# Patient Record
Sex: Male | Born: 2000 | Race: White | Hispanic: No | Marital: Single | Smoking: Never smoker
Health system: Southern US, Community
[De-identification: ages and names within clinical notes are randomized; demographics above are authoritative.]

## PROBLEM LIST (undated history)

## (undated) DIAGNOSIS — F909 Attention-deficit hyperactivity disorder, unspecified type: Secondary | ICD-10-CM

## (undated) HISTORY — DX: Attention-deficit hyperactivity disorder, unspecified type: F90.9

---
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
## 2020-02-27 DIAGNOSIS — Z23 Encounter for immunization: Secondary | ICD-10-CM | POA: Diagnosis not present

## 2020-03-19 DIAGNOSIS — Z23 Encounter for immunization: Secondary | ICD-10-CM | POA: Diagnosis not present

## 2021-07-14 ENCOUNTER — Other Ambulatory Visit (HOSPITAL_COMMUNITY): Payer: Self-pay

## 2021-07-14 MED ORDER — CARESTART COVID-19 HOME TEST VI KIT
PACK | 0 refills | Status: AC
  Filled 2021-07-14: qty 4, 4d supply, fill #0

## 2022-07-15 ENCOUNTER — Ambulatory Visit: Payer: 59 | Admitting: Family Medicine

## 2022-07-15 ENCOUNTER — Encounter: Payer: Self-pay | Admitting: Family Medicine

## 2022-07-15 VITALS — BP 115/72 | HR 81 | Temp 98.3°F | Ht 71.0 in | Wt 157.0 lb

## 2022-07-15 DIAGNOSIS — Z0000 Encounter for general adult medical examination without abnormal findings: Secondary | ICD-10-CM | POA: Insufficient documentation

## 2022-07-15 DIAGNOSIS — F909 Attention-deficit hyperactivity disorder, unspecified type: Secondary | ICD-10-CM | POA: Diagnosis not present

## 2022-07-15 DIAGNOSIS — Z113 Encounter for screening for infections with a predominantly sexual mode of transmission: Secondary | ICD-10-CM | POA: Diagnosis not present

## 2022-07-15 DIAGNOSIS — Z0001 Encounter for general adult medical examination with abnormal findings: Secondary | ICD-10-CM | POA: Diagnosis not present

## 2022-07-15 DIAGNOSIS — Z1159 Encounter for screening for other viral diseases: Secondary | ICD-10-CM | POA: Diagnosis not present

## 2022-07-15 DIAGNOSIS — Z114 Encounter for screening for human immunodeficiency virus [HIV]: Secondary | ICD-10-CM | POA: Diagnosis not present

## 2022-07-15 LAB — CBC WITH DIFFERENTIAL/PLATELET
Absolute Monocytes: 511 cells/uL (ref 200–950)
Eosinophils Relative: 1.1 %
MCH: 31.6 pg (ref 27.0–33.0)
MCV: 89.6 fL (ref 80.0–100.0)
MPV: 10.4 fL (ref 7.5–12.5)
Monocytes Relative: 7.3 %
Neutro Abs: 4851 cells/uL (ref 1500–7800)
Neutrophils Relative %: 69.3 %
Platelets: 269 10*3/uL (ref 140–400)
Total Lymphocyte: 21.6 %

## 2022-07-15 NOTE — Assessment & Plan Note (Signed)

## 2022-07-15 NOTE — Patient Instructions (Signed)
It was great to meet you today and I'm excited to have you join the [facility] practice. I hope you had a positive experience today! If you feel so inclined, please feel free to recommend our practice to friends and family. Harvey Matlack, FNP-C  

## 2022-07-15 NOTE — Assessment & Plan Note (Addendum)
Reports history of ADHD on Concerta many years ago, would like to resume medications. Symptoms have been present for many years and interfere with day to day life at home, work, and school. Adult ADHD Self-Report Scale v1.1 he scored 6 in part A and 8 in part B. EKG done today in office normal. Patient will obtain medical records. Psychiatry referral placed for further evaluation. Conservative management at this time and patient will notify me if he needs medication management.

## 2022-07-15 NOTE — Progress Notes (Signed)
New Patient Office Visit  Subjective    Patient ID: Thomas Richards, male    DOB: 05-May-2001  Age: 21 y.o. MRN: [id number]  CC:  Chief Complaint  Patient presents with   Establish Care    HPI Thomas Richards presents to establish care. Oriented to practice routines and expectations. He was diagnosed with ADHD in elementary school and has been off of medications since high school, previously took Concerta and it worked well for him. Currently experiencing "time blindness" and trouble planning and executing tasks.   Vaccines: flu today, Tdap UTD, HPV UTD STI: consents to testing today    Outpatient Encounter Medications as of 07/15/2022  Medication Sig   COVID-19 At Home Antigen Test ([facility] COVID-19 HOME TEST) KIT Use as directed (Patient not taking: Reported on 07/15/2022)   No facility-administered encounter medications on file as of 07/15/2022.    Past Medical History:  Diagnosis Date   ADHD (attention deficit hyperactivity disorder)     History reviewed. No pertinent surgical history.  History reviewed. No pertinent family history.  Social History   Socioeconomic History   Marital status: Single    Spouse name: Not on file   Number of children: Not on file   Years of education: Not on file   Highest education level: Not on file  Occupational History   Not on file  Tobacco Use   Smoking status: Never   Smokeless tobacco: Never  Substance and Sexual Activity   Alcohol use: Never   Drug use: Never   Sexual activity: Yes  Other Topics Concern   Not on file  Social History Narrative   Pt is currently in school, [employer]   Social Determinants of Health   Financial Resource Strain: Not on file  Food Insecurity: Not on file  Transportation Needs: Not on file  Physical Activity: Not on file  Stress: Not on file  Social Connections: Not on file  Intimate Partner Violence: Not on file    Review of Systems  Constitutional: Negative.    HENT: Negative.    Eyes: Negative.   Respiratory: Negative.    Cardiovascular: Negative.   Gastrointestinal: Negative.   Genitourinary: Negative.   Musculoskeletal: Negative.   Skin: Negative.   Neurological: Negative.   Endo/Heme/Allergies: Negative.   Psychiatric/Behavioral: Negative.    All other systems reviewed and are negative.       Objective    BP 115/72   Pulse 81   Temp 98.3 F (36.8 C) (Oral)   Ht _0  (1.803 m)   Wt 157 lb (71.2 kg)   SpO2 98%   BMI 21.90 kg/m   Physical Exam Vitals and nursing note reviewed.  Constitutional:      Appearance: Normal appearance. He is normal weight.  HENT:     Head: Normocephalic and atraumatic.     Right Ear: Tympanic membrane, ear canal and external ear normal.     Left Ear: Tympanic membrane, ear canal and external ear normal.     Nose: Nose normal.     Mouth/Throat:     Mouth: Mucous membranes are moist.     Pharynx: Oropharynx is clear.  Eyes:     Extraocular Movements: Extraocular movements intact.     Right eye: Normal extraocular motion and no nystagmus.     Left eye: Normal extraocular motion and no nystagmus.     Conjunctiva/sclera: Conjunctivae normal.     Pupils: Pupils are equal, round, and reactive to light.  Cardiovascular:     Rate and Rhythm: Normal rate and regular rhythm.     Pulses: Normal pulses.     Heart sounds: Normal heart sounds.  Pulmonary:     Effort: Pulmonary effort is normal.     Breath sounds: Normal breath sounds.  Abdominal:     General: Abdomen is flat. Bowel sounds are normal.     Palpations: Abdomen is soft.  Genitourinary:    Comments: Deferred using shared decision making Musculoskeletal:        General: Normal range of motion.     Cervical back: Normal range of motion and neck supple.  Skin:    General: Skin is warm and dry.     Capillary Refill: Capillary refill takes less than 2 seconds.  Neurological:     General: No focal deficit present.     Mental Status:  He is alert. Mental status is at baseline.  Psychiatric:        Mood and Affect: Mood normal.        Speech: Speech normal.        Behavior: Behavior normal.        Thought Content: Thought content normal.        Cognition and Memory: Cognition and memory normal.        Judgment: Judgment normal.         Assessment & Plan:   Problem List Items Addressed This Visit       Other   Physical exam, annual - Primary    Today your medical history was reviewed and routine physical exam with labs was performed. Recommend 150 minutes of moderate intensity exercise weekly and consuming a well-balanced diet. Advised to stop smoking if a smoker, avoid smoking if a non-smoker, limit alcohol consumption to 1 drink per day for women and 2 drinks per day for men, and avoid illicit drug use. Counseled on safe sex practices and offered STI testing today. Counseled on the importance of sunscreen use. Counseled in mental health awareness and when to seek medical care. Vaccine maintenance discussed. Appropriate health maintenance items reviewed. Return to office in 1 year for annual physical exam.       Relevant Orders   CBC with Differential/Platelet   COMPLETE METABOLIC PANEL WITH GFR   Lipid panel   HIV Antibody (routine testing w rflx)   Hepatitis C Ab w/rfl RNA, PCR + Geno   RPR   Attention deficit hyperactivity disorder (ADHD)    Reports history of ADHD on Concerta many years ago, would like to resume medications. Symptoms have been present for many years and interfere with day to day life at home, work, and school. Adult ADHD Self-Report Scale v1.1 he scored 6 in part A and 8 in part B. EKG done today in office normal. Patient will obtain medical records. Psychiatry referral placed for further evaluation. Conservative management at this time and patient will notify me if he needs medication management.      Relevant Orders   EKG 12-Lead (Completed)   Ambulatory referral to Psychiatry   Other  Visit Diagnoses     Routine screening for STI (sexually transmitted infection)       Relevant Orders   HIV Antibody (routine testing w rflx)   Hepatitis C Ab w/rfl RNA, PCR + Geno   RPR   Screening for HIV (human immunodeficiency virus)       Relevant Orders   HIV Antibody (routine testing w rflx)   Need for hepatitis  C screening test       Relevant Orders   Hepatitis C Ab w/rfl RNA, PCR + Geno       Return in about 1 year (around 07/16/2023) for annual physical.   Rubie Maid, FNP

## 2022-07-16 LAB — COMPLETE METABOLIC PANEL WITH GFR
AG Ratio: 1.8 (calc) (ref 1.0–2.5)
ALT: 53 U/L — ABNORMAL HIGH (ref 9–46)
AST: 23 U/L (ref 10–40)
Albumin: 4.8 g/dL (ref 3.6–5.1)
Alkaline phosphatase (APISO): 58 U/L (ref 36–130)
BUN: 16 mg/dL (ref 7–25)
CO2: 27 mmol/L (ref 20–32)
Calcium: 9.8 mg/dL (ref 8.6–10.3)
Chloride: 102 mmol/L (ref 98–110)
Creat: 1.1 mg/dL (ref 0.60–1.24)
Globulin: 2.7 g/dL (calc) (ref 1.9–3.7)
Glucose, Bld: 84 mg/dL (ref 65–99)
Potassium: 4.3 mmol/L (ref 3.5–5.3)
Sodium: 140 mmol/L (ref 135–146)
Total Bilirubin: 1.5 mg/dL — ABNORMAL HIGH (ref 0.2–1.2)
Total Protein: 7.5 g/dL (ref 6.1–8.1)
eGFR: 97 mL/min/{1.73_m2} (ref >=60)

## 2022-07-16 LAB — CBC WITH DIFFERENTIAL/PLATELET
Basophils Absolute: 49 cells/uL (ref 0–200)
Basophils Relative: 0.7 %
Eosinophils Absolute: 77 cells/uL (ref 15–500)
HCT: 47.3 % (ref 38.5–50.0)
Hemoglobin: 16.7 g/dL (ref 13.2–17.1)
Lymphs Abs: 1512 cells/uL (ref 850–3900)
MCHC: 35.3 g/dL (ref 32.0–36.0)
RBC: 5.28 10*6/uL (ref 4.20–5.80)
RDW: 12.3 % (ref 11.0–15.0)
WBC: 7 10*3/uL (ref 3.8–10.8)

## 2022-07-16 LAB — LIPID PANEL
Cholesterol: 159 mg/dL (ref <200)
HDL: 53 mg/dL (ref >=40)
LDL Cholesterol (Calc): 90 mg/dL (calc)
Non-HDL Cholesterol (Calc): 106 mg/dL (calc) (ref <130)
Total CHOL/HDL Ratio: 3 (calc) (ref <5.0)
Triglycerides: 73 mg/dL (ref <150)

## 2022-07-16 LAB — RPR: RPR Ser Ql: NONREACTIVE

## 2022-07-16 LAB — HIV ANTIBODY (ROUTINE TESTING W REFLEX): HIV 1&2 Ab, 4th Generation: NONREACTIVE

## 2022-07-16 LAB — HEPATITIS C AB W/RFL RNA, PCR + GENO: Hepatitis C Ab: NONREACTIVE

## 2022-07-29 NOTE — Addendum Note (Signed)
Addended by: Rubie Maid on: 07/29/2022 08:18 AM   Modules accepted: Level of Service

## 2022-09-30 ENCOUNTER — Telehealth: Payer: Self-pay | Admitting: Family Medicine

## 2022-09-30 NOTE — Telephone Encounter (Signed)
Patient called to check on status of medical record request; stated his previous medical records were sent to [place] twice (08/18/22, and 09/11/22).  Requesting call back to verify records received as proof of diagnosis so he can receive a refill of his medication (lapse in dose of 2 months).  Please advise at [phone].

## 2022-09-30 NOTE — Telephone Encounter (Signed)
Spoke w/pt this afternoon, apologize about the records. Per pt"it's ok" he already has an appt w/a psychologist to get re-Dx and hopefully get medications.   Told pt if he needs anything else to contact our office. Pt voiced understanding.

## 2022-10-09 ENCOUNTER — Other Ambulatory Visit (HOSPITAL_BASED_OUTPATIENT_CLINIC_OR_DEPARTMENT_OTHER): Payer: Self-pay

## 2022-10-09 DIAGNOSIS — F418 Other specified anxiety disorders: Secondary | ICD-10-CM | POA: Diagnosis not present

## 2022-10-09 DIAGNOSIS — F900 Attention-deficit hyperactivity disorder, predominantly inattentive type: Secondary | ICD-10-CM | POA: Diagnosis not present

## 2022-10-09 MED ORDER — METHYLPHENIDATE HCL ER (OSM) 18 MG PO TBCR
18.0000 mg | EXTENDED_RELEASE_TABLET | Freq: Every day | ORAL | 0 refills | Status: AC
  Filled 2022-10-09: qty 30, 30d supply, fill #0

## 2022-10-13 DIAGNOSIS — F331 Major depressive disorder, recurrent, moderate: Secondary | ICD-10-CM | POA: Diagnosis not present

## 2022-10-22 DIAGNOSIS — F331 Major depressive disorder, recurrent, moderate: Secondary | ICD-10-CM | POA: Diagnosis not present

## 2022-10-23 ENCOUNTER — Other Ambulatory Visit (HOSPITAL_BASED_OUTPATIENT_CLINIC_OR_DEPARTMENT_OTHER): Payer: Self-pay

## 2022-10-23 DIAGNOSIS — F418 Other specified anxiety disorders: Secondary | ICD-10-CM | POA: Diagnosis not present

## 2022-10-23 DIAGNOSIS — F900 Attention-deficit hyperactivity disorder, predominantly inattentive type: Secondary | ICD-10-CM | POA: Diagnosis not present

## 2022-10-23 MED ORDER — METHYLPHENIDATE HCL ER (OSM) 36 MG PO TBCR: 36.0000 mg | EXTENDED_RELEASE_TABLET | Freq: Every day | ORAL | 0 refills | Status: AC

## 2022-11-02 DIAGNOSIS — F331 Major depressive disorder, recurrent, moderate: Secondary | ICD-10-CM | POA: Diagnosis not present

## 2022-11-12 DIAGNOSIS — F331 Major depressive disorder, recurrent, moderate: Secondary | ICD-10-CM | POA: Diagnosis not present

## 2022-11-13 ENCOUNTER — Other Ambulatory Visit (HOSPITAL_BASED_OUTPATIENT_CLINIC_OR_DEPARTMENT_OTHER): Payer: Self-pay

## 2022-11-13 DIAGNOSIS — F900 Attention-deficit hyperactivity disorder, predominantly inattentive type: Secondary | ICD-10-CM | POA: Diagnosis not present

## 2022-11-13 DIAGNOSIS — F418 Other specified anxiety disorders: Secondary | ICD-10-CM | POA: Diagnosis not present

## 2022-11-13 MED ORDER — METHYLPHENIDATE HCL ER (OSM) 36 MG PO TBCR
36.0000 mg | EXTENDED_RELEASE_TABLET | Freq: Every day | ORAL | 0 refills | Status: AC
  Filled 2022-11-13: qty 30, 30d supply, fill #0

## 2022-11-15 ENCOUNTER — Other Ambulatory Visit (HOSPITAL_BASED_OUTPATIENT_CLINIC_OR_DEPARTMENT_OTHER): Payer: Self-pay

## 2022-11-16 ENCOUNTER — Other Ambulatory Visit (HOSPITAL_BASED_OUTPATIENT_CLINIC_OR_DEPARTMENT_OTHER): Payer: Self-pay

## 2022-11-24 ENCOUNTER — Other Ambulatory Visit (HOSPITAL_BASED_OUTPATIENT_CLINIC_OR_DEPARTMENT_OTHER): Payer: Self-pay

## 2022-11-26 DIAGNOSIS — F331 Major depressive disorder, recurrent, moderate: Secondary | ICD-10-CM | POA: Diagnosis not present

## 2022-12-01 DIAGNOSIS — F331 Major depressive disorder, recurrent, moderate: Secondary | ICD-10-CM | POA: Diagnosis not present

## 2022-12-22 DIAGNOSIS — F331 Major depressive disorder, recurrent, moderate: Secondary | ICD-10-CM | POA: Diagnosis not present

## 2022-12-25 DIAGNOSIS — F418 Other specified anxiety disorders: Secondary | ICD-10-CM | POA: Diagnosis not present

## 2022-12-25 DIAGNOSIS — F900 Attention-deficit hyperactivity disorder, predominantly inattentive type: Secondary | ICD-10-CM | POA: Diagnosis not present

## 2022-12-26 ENCOUNTER — Other Ambulatory Visit (HOSPITAL_BASED_OUTPATIENT_CLINIC_OR_DEPARTMENT_OTHER): Payer: Self-pay

## 2022-12-26 MED ORDER — METHYLPHENIDATE HCL ER (OSM) 36 MG PO TBCR
36.0000 mg | EXTENDED_RELEASE_TABLET | Freq: Every day | ORAL | 0 refills | Status: AC
  Filled 2022-12-26: qty 30, 30d supply, fill #0

## 2022-12-28 ENCOUNTER — Other Ambulatory Visit (HOSPITAL_BASED_OUTPATIENT_CLINIC_OR_DEPARTMENT_OTHER): Payer: Self-pay

## 2022-12-30 ENCOUNTER — Other Ambulatory Visit (HOSPITAL_BASED_OUTPATIENT_CLINIC_OR_DEPARTMENT_OTHER): Payer: Self-pay

## 2023-01-05 DIAGNOSIS — F331 Major depressive disorder, recurrent, moderate: Secondary | ICD-10-CM | POA: Diagnosis not present

## 2023-01-18 ENCOUNTER — Other Ambulatory Visit (HOSPITAL_BASED_OUTPATIENT_CLINIC_OR_DEPARTMENT_OTHER): Payer: Self-pay

## 2023-01-21 DIAGNOSIS — F331 Major depressive disorder, recurrent, moderate: Secondary | ICD-10-CM | POA: Diagnosis not present

## 2023-01-22 ENCOUNTER — Other Ambulatory Visit (HOSPITAL_BASED_OUTPATIENT_CLINIC_OR_DEPARTMENT_OTHER): Payer: Self-pay

## 2023-01-22 DIAGNOSIS — F900 Attention-deficit hyperactivity disorder, predominantly inattentive type: Secondary | ICD-10-CM | POA: Diagnosis not present

## 2023-01-22 DIAGNOSIS — F418 Other specified anxiety disorders: Secondary | ICD-10-CM | POA: Diagnosis not present

## 2023-01-22 MED ORDER — METHYLPHENIDATE HCL ER (OSM) 36 MG PO TBCR: 36.0000 mg | EXTENDED_RELEASE_TABLET | Freq: Every day | ORAL | 0 refills | Status: AC

## 2023-01-22 MED ORDER — METHYLPHENIDATE HCL ER (OSM) 36 MG PO TBCR
36.0000 mg | EXTENDED_RELEASE_TABLET | Freq: Every day | ORAL | 0 refills | Status: AC
  Filled 2023-01-22: qty 30, 30d supply, fill #0

## 2023-01-29 ENCOUNTER — Other Ambulatory Visit: Payer: Self-pay

## 2023-02-11 ENCOUNTER — Other Ambulatory Visit: Payer: Self-pay

## 2023-02-11 ENCOUNTER — Other Ambulatory Visit (HOSPITAL_BASED_OUTPATIENT_CLINIC_OR_DEPARTMENT_OTHER): Payer: Self-pay

## 2023-02-12 ENCOUNTER — Other Ambulatory Visit (HOSPITAL_BASED_OUTPATIENT_CLINIC_OR_DEPARTMENT_OTHER): Payer: Self-pay

## 2023-02-26 DIAGNOSIS — F331 Major depressive disorder, recurrent, moderate: Secondary | ICD-10-CM | POA: Diagnosis not present

## 2023-03-01 ENCOUNTER — Other Ambulatory Visit (HOSPITAL_BASED_OUTPATIENT_CLINIC_OR_DEPARTMENT_OTHER): Payer: Self-pay

## 2023-03-30 DIAGNOSIS — F331 Major depressive disorder, recurrent, moderate: Secondary | ICD-10-CM | POA: Diagnosis not present

## 2023-04-16 ENCOUNTER — Other Ambulatory Visit (HOSPITAL_BASED_OUTPATIENT_CLINIC_OR_DEPARTMENT_OTHER): Payer: Self-pay

## 2023-04-16 DIAGNOSIS — F418 Other specified anxiety disorders: Secondary | ICD-10-CM | POA: Diagnosis not present

## 2023-04-16 DIAGNOSIS — F900 Attention-deficit hyperactivity disorder, predominantly inattentive type: Secondary | ICD-10-CM | POA: Diagnosis not present

## 2023-04-16 MED ORDER — METHYLPHENIDATE HCL ER (OSM) 36 MG PO TBCR: 36.0000 mg | EXTENDED_RELEASE_TABLET | Freq: Every day | ORAL | 0 refills | Status: AC

## 2023-04-16 MED ORDER — METHYLPHENIDATE HCL ER (OSM) 36 MG PO TBCR
36.0000 mg | EXTENDED_RELEASE_TABLET | Freq: Every day | ORAL | 0 refills | Status: AC
  Filled 2023-04-16: qty 30, 30d supply, fill #0

## 2023-04-26 ENCOUNTER — Other Ambulatory Visit (HOSPITAL_BASED_OUTPATIENT_CLINIC_OR_DEPARTMENT_OTHER): Payer: Self-pay

## 2023-07-09 ENCOUNTER — Other Ambulatory Visit (HOSPITAL_BASED_OUTPATIENT_CLINIC_OR_DEPARTMENT_OTHER): Payer: Self-pay

## 2023-07-09 DIAGNOSIS — F900 Attention-deficit hyperactivity disorder, predominantly inattentive type: Secondary | ICD-10-CM | POA: Diagnosis not present

## 2023-07-09 DIAGNOSIS — F418 Other specified anxiety disorders: Secondary | ICD-10-CM | POA: Diagnosis not present

## 2023-07-09 MED ORDER — MIRTAZAPINE 15 MG PO TABS
15.0000 mg | ORAL_TABLET | Freq: Every day | ORAL | 0 refills | Status: DC
  Filled 2023-07-09: qty 30, 30d supply, fill #0

## 2023-07-09 MED ORDER — METHYLPHENIDATE HCL ER (OSM) 36 MG PO TBCR
36.0000 mg | EXTENDED_RELEASE_TABLET | Freq: Every day | ORAL | 0 refills | Status: AC
  Filled 2023-07-09: qty 30, 30d supply, fill #0

## 2023-08-02 DIAGNOSIS — F331 Major depressive disorder, recurrent, moderate: Secondary | ICD-10-CM | POA: Diagnosis not present

## 2023-08-06 ENCOUNTER — Other Ambulatory Visit (HOSPITAL_BASED_OUTPATIENT_CLINIC_OR_DEPARTMENT_OTHER): Payer: Self-pay

## 2023-08-06 DIAGNOSIS — F900 Attention-deficit hyperactivity disorder, predominantly inattentive type: Secondary | ICD-10-CM | POA: Diagnosis not present

## 2023-08-06 DIAGNOSIS — F418 Other specified anxiety disorders: Secondary | ICD-10-CM | POA: Diagnosis not present

## 2023-08-06 MED ORDER — MIRTAZAPINE 15 MG PO TABS
15.0000 mg | ORAL_TABLET | Freq: Every day | ORAL | 2 refills | Status: AC
  Filled 2023-08-06 – 2023-10-25 (×2): qty 30, 30d supply, fill #0

## 2023-08-06 MED ORDER — METHYLPHENIDATE HCL ER (OSM) 54 MG PO TBCR
54.0000 mg | EXTENDED_RELEASE_TABLET | Freq: Every day | ORAL | 0 refills | Status: AC
  Filled 2023-08-06 – 2023-10-25 (×2): qty 30, 30d supply, fill #0

## 2023-08-06 MED ORDER — METHYLPHENIDATE HCL ER (OSM) 54 MG PO TBCR: EXTENDED_RELEASE_TABLET | ORAL | 0 refills | Status: AC

## 2023-08-06 MED ORDER — METHYLPHENIDATE HCL ER (OSM) 54 MG PO TBCR
EXTENDED_RELEASE_TABLET | ORAL | 0 refills | Status: AC
  Filled 2024-01-13: qty 30, 30d supply, fill #0

## 2023-08-07 ENCOUNTER — Other Ambulatory Visit (HOSPITAL_BASED_OUTPATIENT_CLINIC_OR_DEPARTMENT_OTHER): Payer: Self-pay

## 2023-08-17 ENCOUNTER — Other Ambulatory Visit (HOSPITAL_BASED_OUTPATIENT_CLINIC_OR_DEPARTMENT_OTHER): Payer: Self-pay

## 2023-10-25 ENCOUNTER — Other Ambulatory Visit (HOSPITAL_BASED_OUTPATIENT_CLINIC_OR_DEPARTMENT_OTHER): Payer: Self-pay

## 2023-10-28 ENCOUNTER — Other Ambulatory Visit (HOSPITAL_BASED_OUTPATIENT_CLINIC_OR_DEPARTMENT_OTHER): Payer: Self-pay

## 2023-10-28 DIAGNOSIS — F418 Other specified anxiety disorders: Secondary | ICD-10-CM | POA: Diagnosis not present

## 2023-10-28 DIAGNOSIS — F900 Attention-deficit hyperactivity disorder, predominantly inattentive type: Secondary | ICD-10-CM | POA: Diagnosis not present

## 2023-10-28 MED ORDER — METHYLPHENIDATE HCL ER (OSM) 54 MG PO TBCR: 54.0000 mg | EXTENDED_RELEASE_TABLET | Freq: Every day | ORAL | 0 refills | Status: AC

## 2023-10-28 MED ORDER — MIRTAZAPINE 15 MG PO TABS
15.0000 mg | ORAL_TABLET | Freq: Every day | ORAL | 0 refills | Status: AC
  Filled 2023-10-28 – 2024-01-13 (×2): qty 30, 30d supply, fill #0

## 2023-10-28 MED ORDER — METHYLPHENIDATE HCL ER (OSM) 54 MG PO TBCR
54.0000 mg | EXTENDED_RELEASE_TABLET | Freq: Every day | ORAL | 0 refills | Status: AC
  Filled 2024-04-07: qty 30, 30d supply, fill #0

## 2024-01-13 ENCOUNTER — Other Ambulatory Visit (HOSPITAL_BASED_OUTPATIENT_CLINIC_OR_DEPARTMENT_OTHER): Payer: Self-pay

## 2024-01-20 ENCOUNTER — Other Ambulatory Visit (HOSPITAL_BASED_OUTPATIENT_CLINIC_OR_DEPARTMENT_OTHER): Payer: Self-pay

## 2024-01-20 DIAGNOSIS — F418 Other specified anxiety disorders: Secondary | ICD-10-CM | POA: Diagnosis not present

## 2024-01-20 DIAGNOSIS — F900 Attention-deficit hyperactivity disorder, predominantly inattentive type: Secondary | ICD-10-CM | POA: Diagnosis not present

## 2024-01-20 MED ORDER — METHYLPHENIDATE HCL ER (OSM) 54 MG PO TBCR: 54.0000 mg | EXTENDED_RELEASE_TABLET | Freq: Every day | ORAL | 0 refills | Status: AC

## 2024-01-20 MED ORDER — MIRTAZAPINE 15 MG PO TABS
15.0000 mg | ORAL_TABLET | Freq: Every day | ORAL | 0 refills | Status: AC
  Filled 2024-04-07: qty 90, 90d supply, fill #0

## 2024-02-09 DIAGNOSIS — F321 Major depressive disorder, single episode, moderate: Secondary | ICD-10-CM | POA: Diagnosis not present

## 2024-02-16 DIAGNOSIS — F321 Major depressive disorder, single episode, moderate: Secondary | ICD-10-CM | POA: Diagnosis not present

## 2024-04-07 ENCOUNTER — Other Ambulatory Visit (HOSPITAL_BASED_OUTPATIENT_CLINIC_OR_DEPARTMENT_OTHER): Payer: Self-pay

## 2024-04-13 DIAGNOSIS — F418 Other specified anxiety disorders: Secondary | ICD-10-CM | POA: Diagnosis not present

## 2024-04-13 DIAGNOSIS — F900 Attention-deficit hyperactivity disorder, predominantly inattentive type: Secondary | ICD-10-CM | POA: Diagnosis not present

## 2024-04-14 ENCOUNTER — Other Ambulatory Visit (HOSPITAL_BASED_OUTPATIENT_CLINIC_OR_DEPARTMENT_OTHER): Payer: Self-pay

## 2024-04-14 MED ORDER — MIRTAZAPINE 15 MG PO TABS: 15.0000 mg | ORAL_TABLET | Freq: Every day | ORAL | 0 refills | Status: AC

## 2024-04-14 MED ORDER — METHYLPHENIDATE HCL ER (OSM) 54 MG PO TBCR: 54.0000 mg | EXTENDED_RELEASE_TABLET | Freq: Every day | ORAL | 0 refills | Status: AC
